# Patient Record
Sex: Female | Born: 1960 | Race: Black or African American | Hispanic: No | Smoking: Current every day smoker
Health system: Southern US, Community
[De-identification: ages and names within clinical notes are randomized; demographics above are authoritative.]

## PROBLEM LIST (undated history)

## (undated) DIAGNOSIS — E119 Type 2 diabetes mellitus without complications: Secondary | ICD-10-CM

## (undated) DIAGNOSIS — I10 Essential (primary) hypertension: Secondary | ICD-10-CM

## (undated) HISTORY — PX: FOOT SURGERY: SHX648

## (undated) HISTORY — PX: ABDOMINAL HYSTERECTOMY: SHX81

## (undated) HISTORY — PX: BREAST SURGERY: SHX581

---

## 2019-04-19 ENCOUNTER — Emergency Department: Payer: Medicaid - Out of State

## 2019-04-19 ENCOUNTER — Emergency Department
Admission: EM | Admit: 2019-04-19 | Discharge: 2019-04-19 | Disposition: A | Discharge status: Home / Self Care | Payer: Medicaid - Out of State | Attending: Emergency Medicine | Admitting: Emergency Medicine

## 2019-04-19 ENCOUNTER — Other Ambulatory Visit: Payer: Self-pay

## 2019-04-19 DIAGNOSIS — S82852A Displaced trimalleolar fracture of left lower leg, initial encounter for closed fracture: Secondary | ICD-10-CM | POA: Diagnosis not present

## 2019-04-19 DIAGNOSIS — W000XXA Fall on same level due to ice and snow, initial encounter: Secondary | ICD-10-CM | POA: Insufficient documentation

## 2019-04-19 DIAGNOSIS — F17200 Nicotine dependence, unspecified, uncomplicated: Secondary | ICD-10-CM | POA: Insufficient documentation

## 2019-04-19 DIAGNOSIS — E119 Type 2 diabetes mellitus without complications: Secondary | ICD-10-CM | POA: Insufficient documentation

## 2019-04-19 DIAGNOSIS — Y999 Unspecified external cause status: Secondary | ICD-10-CM | POA: Insufficient documentation

## 2019-04-19 DIAGNOSIS — W009XXA Unspecified fall due to ice and snow, initial encounter: Secondary | ICD-10-CM

## 2019-04-19 DIAGNOSIS — I10 Essential (primary) hypertension: Secondary | ICD-10-CM | POA: Diagnosis not present

## 2019-04-19 DIAGNOSIS — Y939 Activity, unspecified: Secondary | ICD-10-CM | POA: Diagnosis not present

## 2019-04-19 DIAGNOSIS — Y929 Unspecified place or not applicable: Secondary | ICD-10-CM | POA: Diagnosis not present

## 2019-04-19 DIAGNOSIS — S99912A Unspecified injury of left ankle, initial encounter: Secondary | ICD-10-CM | POA: Diagnosis present

## 2019-04-19 HISTORY — DX: Type 2 diabetes mellitus without complications: E11.9

## 2019-04-19 HISTORY — DX: Essential (primary) hypertension: I10

## 2019-04-19 MED ORDER — OXYCODONE-ACETAMINOPHEN 7.5-325 MG PO TABS: 1.0000 | ORAL_TABLET | Freq: Four times a day (QID) | ORAL | 0 refills | Status: AC | PRN

## 2019-04-19 MED ORDER — MORPHINE SULFATE (PF) 4 MG/ML IV SOLN
INTRAVENOUS | Status: AC
  Filled 2019-04-19: qty 1

## 2019-04-19 MED ORDER — MORPHINE SULFATE (PF) 2 MG/ML IV SOLN
INTRAVENOUS | Status: AC
  Filled 2019-04-19: qty 1

## 2019-04-19 MED ORDER — MORPHINE SULFATE (PF) 4 MG/ML IV SOLN
4.0000 mg | Freq: Once | INTRAVENOUS | Status: AC
  Administered 2019-04-19: 4 mg via INTRAVENOUS

## 2019-04-19 MED ORDER — MORPHINE SULFATE (PF) 2 MG/ML IV SOLN
2.0000 mg | Freq: Once | INTRAVENOUS | Status: AC
  Administered 2019-04-19: 2 mg via INTRAVENOUS

## 2019-04-19 NOTE — ED Notes (Signed)
ED Provider at bedside. 

## 2019-04-19 NOTE — ED Notes (Signed)
Pt hit call bell requesting ice pack to be removed, stating it was hurting more to have ice pack on. Requesting pain medication once more. Informed provider, waiting on ortho to call back.

## 2019-04-19 NOTE — ED Notes (Signed)
Xray at bedside to tib/fix xray

## 2019-04-19 NOTE — ED Provider Notes (Signed)
[facility] Emergency Department Provider Note   ____________________________________________   First MD Initiated Contact with Patient 04/19/19 [phone]     (approximate)  I have reviewed the triage vital signs and the nursing notes.   HISTORY  Chief Complaint Ankle Pain   HPI Tammy Kaiser is a 59 y.o. female presents to the ED via EMS after she fell down a wooden ramp that she believes was covered with ice.  Patient states that her left ankle snapped.  She denies any head injury or loss of consciousness.  She was given fentanyl 150 approximately 30 minutes prior to arrival reported by EMS.  She denies any previous injury to her ankle.  Patient was here visiting family.  She rates her pain as 10 over 10.       Past Medical History:  Diagnosis Date  . Diabetes mellitus without complication ([place])   . Hypertension     There are no problems to display for this patient.   Past Surgical History:  Procedure Laterality Date  . ABDOMINAL HYSTERECTOMY    . BREAST SURGERY    . FOOT SURGERY      Prior to Admission medications   Medication Sig Start Date End Date Taking? Authorizing Provider  oxyCODONE-acetaminophen (PERCOCET) 7.5-325 MG tablet Take 1 tablet by mouth every 6 (six) hours as needed for severe pain. 04/19/19 04/18/20  Johnn Hai, PA-C    Allergies Patient has no known allergies.  History reviewed. No pertinent family history.  Social History Social History   Tobacco Use  . Smoking status: Current Every Day Smoker  Substance Use Topics  . Alcohol use: Not Currently  . Drug use: Not on file    Review of Systems Constitutional: No fever/chills Eyes: No visual changes. ENT: No trauma. Cardiovascular: Denies chest pain. Respiratory: Denies shortness of breath. Gastrointestinal: No abdominal pain.  No nausea, no vomiting.  Musculoskeletal: Positive for left ankle pain. Skin: Negative for rash. Neurological: Negative for  headaches, focal weakness or numbness.  ____________________________________________   PHYSICAL EXAM:  VITAL SIGNS: ED Triage Vitals  Enc Vitals Group     BP 04/19/19 0726 (!) 160/63     Pulse Rate 04/19/19 0726 68     Resp 04/19/19 0726 18     Temp 04/19/19 0726 98 F (36.7 C)     Temp Source 04/19/19 0726 Oral     SpO2 04/19/19 0726 100 %     Weight 04/19/19 0725 207 lb (93.9 kg)     Height 04/19/19 0725 5\' 4"  (1.626 m)     Head Circumference --      Peak Flow --      Pain Score 04/19/19 0725 10     Pain Loc --      Pain Edu? --      Excl. in [place]? --    Constitutional: Alert and oriented. Well appearing and in no acute distress. Eyes: Conjunctivae are normal.  Head: Atraumatic. Neck: No stridor.   Cardiovascular: Normal rate, regular rhythm. Grossly normal heart sounds.  Good peripheral circulation. Respiratory: Normal respiratory effort.  No retractions. Lungs CTAB. Gastrointestinal: Soft and nontender. No distention.  No CVA tenderness. Musculoskeletal: Nontender left hip or thigh.  No point tenderness noted on palpation of the left knee or evidence of injury.  There is marked tenderness and soft tissue edema noted to the left ankle and patient is exquisitely tender to light palpation.  Pulse is palpable dorsalis pedis.  Motor sensory function intact  distally to the injury.  Skin is intact and no discoloration present. Neurologic:  Normal speech and language. No gross focal neurologic deficits are appreciated. No gait instability. Skin:  Skin is warm, dry and intact. No rash noted. Psychiatric: Mood and affect are normal. Speech and behavior are normal.  ____________________________________________   LABS (all labs ordered are listed, but only abnormal results are displayed)  Labs Reviewed - No data to display  RADIOLOGY   Official radiology report(s): DG Tibia/Fibula Left  Result Date: 04/19/2019 CLINICAL DATA:  Fall.  Ankle injury. EXAM: LEFT TIBIA AND FIBULA -  2 VIEW COMPARISON:  No prior. FINDINGS: Diffuse soft tissue swelling. Small medial malleolar displaced avulsion fractures noted. Prominent displaced oblique fracture of the distal left fibular shaft noted. Slight displaced fracture of the posterior malleolus noted. IMPRESSION: Trimalleolar fracture. Electronically Signed   By: Maisie Fus  Register   On: 04/19/2019 09:01   DG Ankle Complete Left  Result Date: 04/19/2019 CLINICAL DATA:  Pain following fall EXAM: LEFT ANKLE COMPLETE - 3+ VIEW COMPARISON:  None. FINDINGS: Frontal, oblique, and lateral views were obtained. There is a spiral fracture of the distal fibular diaphysis with mild posterior angulation and lateral displacement of the distal major fracture fragment with respect proximal fragment. There is an avulsion arising from the medial malleolus. There is ankle mortise disruption with the hindfoot posterior to the tibial plafond. There is no appreciable joint space narrowing. There is an inferior calcaneal spur. IMPRESSION: Comminuted spiral fracture of the distal fibular diaphysis with displacement and angulation. Avulsion medial malleolus. Ankle mortise disruption with the hindfoot posterior to the tibial plafond. Inferior calcaneal spur noted. Electronically Signed   By: Bretta Bang III M.D.   On: 04/19/2019 07:56    ____________________________________________   PROCEDURES  Procedure(s) performed (including Critical Care):  Procedures Posterior and sugar tong OCL splint was applied to the left lower extremity and patient was given crutches.  ____________________________________________   INITIAL IMPRESSION / ASSESSMENT AND PLAN / ED COURSE  As part of my medical decision making, I reviewed the following data within the electronic MEDICAL RECORD NUMBER Notes from prior ED visits and [place] Controlled Substance Database  59 year old female presents to the ED via EMS with complaint of left ankle pain after she slipped on ice this morning.   Physical exam is consistent with a left ankle fracture.  X-rays showed a trimalleolar fracture which was discussed with Dr. Rosita Kea who is on-call for orthopedics today.  After talking with the patient she prefers to go back home to [place] and states she will figure out a way to get there.  Patient was made aware that she cannot take pain medication and drive.  She is also aware that she cannot weight-bear on her ankle and was given a set of crutches.  A prescription was sent to a local pharmacy for her family to pick up for pain.  Patient is encouraged to ice elevate her ankle as frequently as possible and to not remove the splint until she has been seen by the orthopedist in [place].  A copy of her x-rays was put on a CD and patient was handed these to take to her orthopedist.  ____________________________________________   FINAL CLINICAL IMPRESSION(S) / ED DIAGNOSES  Final diagnoses:  Trimalleolar fracture of ankle, closed, left, initial encounter  Fall due to slipping on ice or snow, initial encounter     ED Discharge Orders         Ordered  oxyCODONE-acetaminophen (PERCOCET) 7.5-325 MG tablet  Every 6 hours PRN     04/19/19 1009           Note:  This document was prepared using Dragon voice recognition software and may include unintentional dictation errors.    Tommi Rumps, PA-C 04/19/19 1317    Chesley Noon, MD 04/19/19 2139

## 2019-04-19 NOTE — Discharge Instructions (Signed)
Call and make an appointment with your orthopedist as soon as you get home.  Leave the splint on until seen by your orthopedist.  Ice and elevate frequently to decrease swelling and help with pain.  Do not take your pain medication and drive as it could cause drowsiness and increase your risk for injury.  Use crutches anytime you are up and do not bear weight on your injured leg.  Take the CD to your doctor's appointment so that they can see your x-rays.

## 2019-04-19 NOTE — ED Notes (Signed)
Pt hit call bell requesting pain medicine. Informed provider. Waiting to hear back from ortho at this time.

## 2019-04-19 NOTE — ED Notes (Signed)
Pt hit call bell requesting more pain medicine. Informed provider. Provider at bedside.

## 2019-04-19 NOTE — ED Triage Notes (Signed)
Pt arrives via [facility] from slipping on ice and injuring L ankle. Swelling and deformity noted. fentanyl given by EMS. 20 L AC. VSS wit EMS. Pt requested pain medication multiple times while in triage.

## 2019-04-19 NOTE — ED Notes (Signed)
EMS stated they gave pt fentanyl between 7 and 7:05am.

## 2020-11-02 IMAGING — DX DG ANKLE COMPLETE 3+V*L*
3 series · 3 of 3 positions shown · non-contrast
Comparison: None.

CLINICAL DATA: Pain following fall

EXAM:
LEFT ANKLE COMPLETE - 3+ VIEW

[ankle ap]
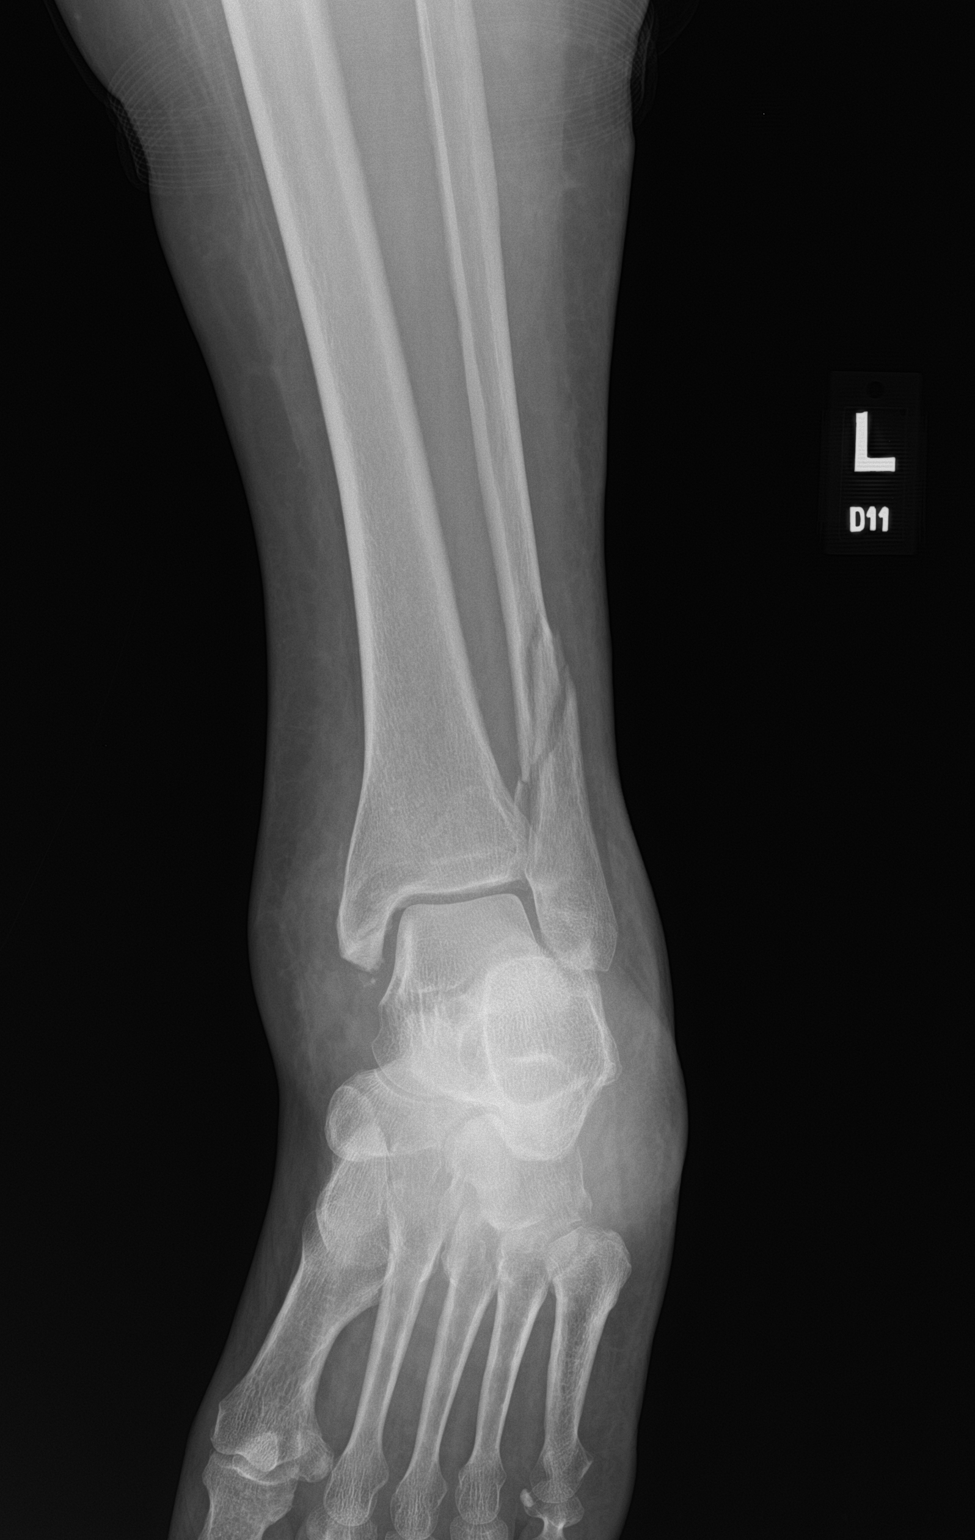

[ankle lat]
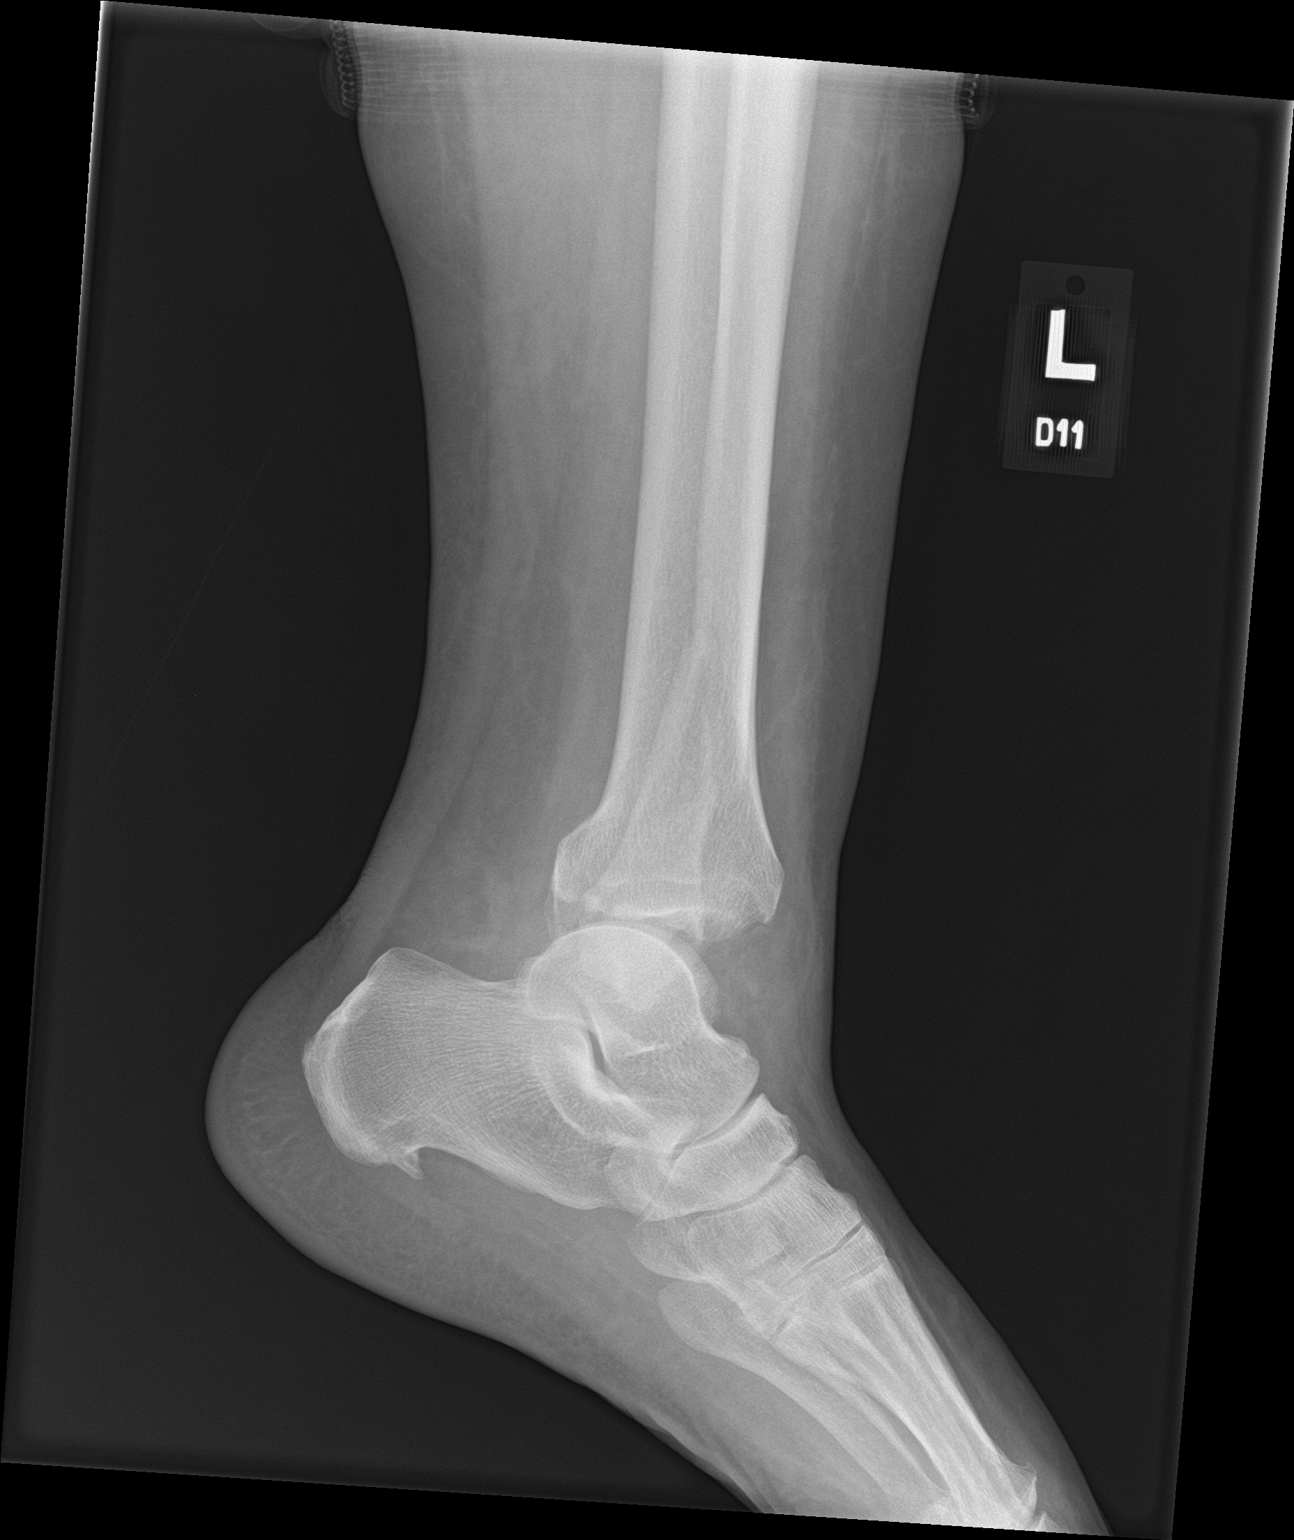

[ankle obl]
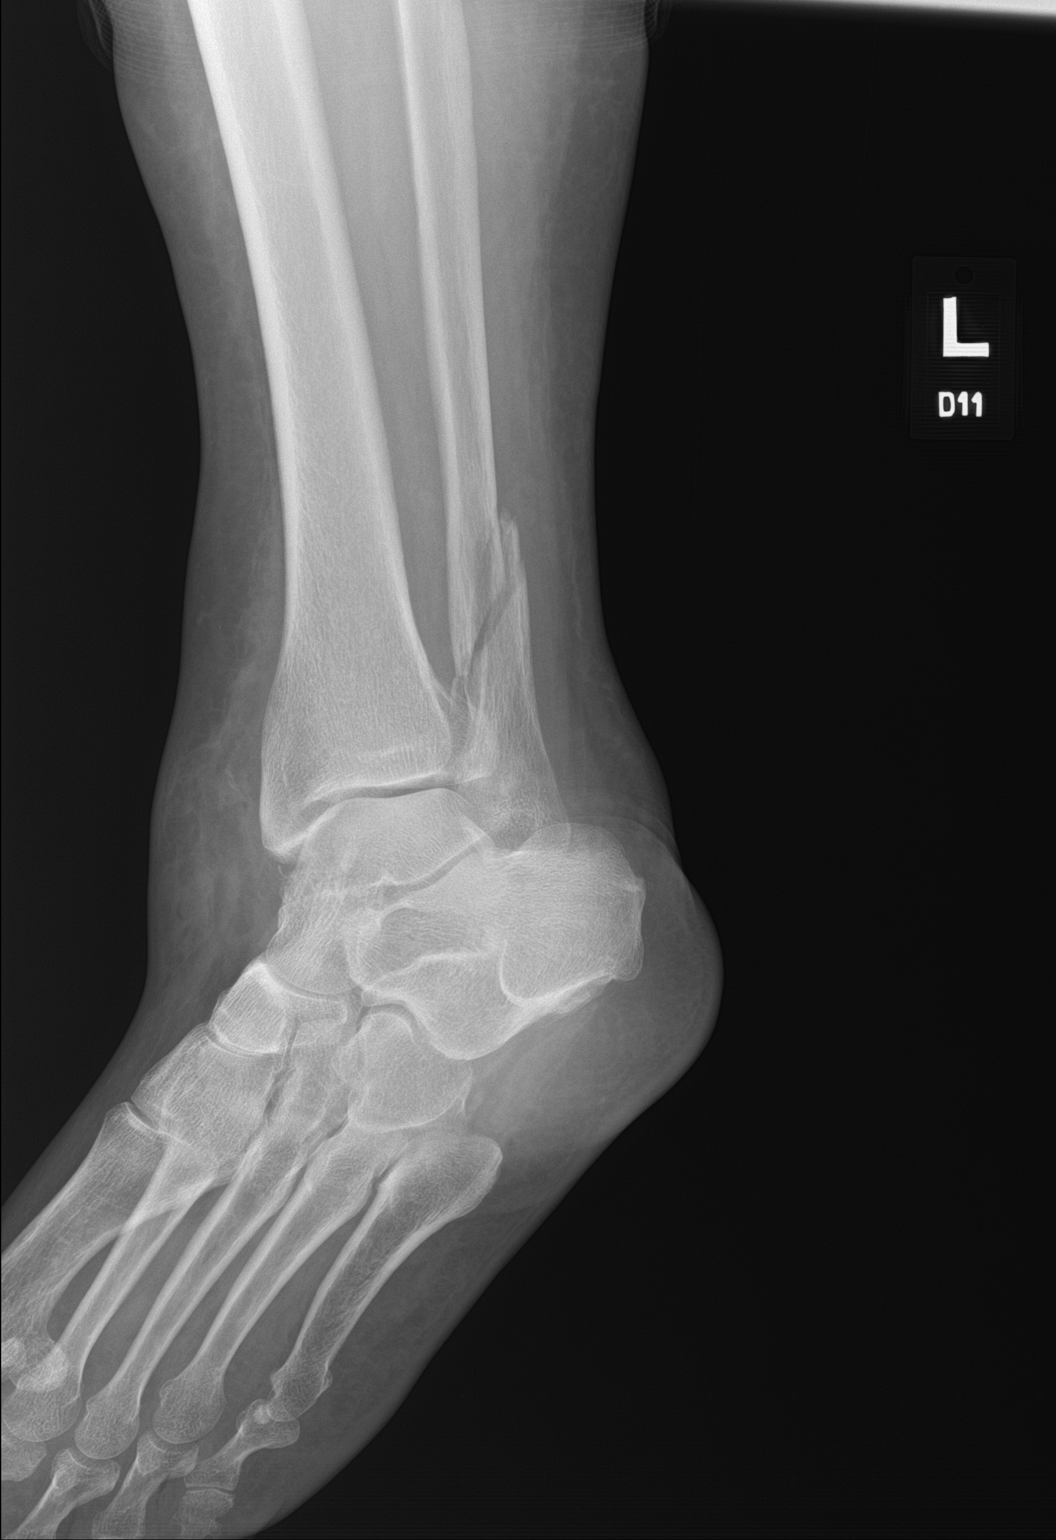

[3 of 3 positions shown; findings below may reference images not displayed]

FINDINGS: Frontal, oblique, and lateral views were obtained. There is a spiral
fracture of the distal fibular diaphysis with mild posterior
angulation and lateral displacement of the distal major fracture
fragment with respect proximal fragment. There is an avulsion
arising from the medial malleolus. There is ankle mortise disruption
with the hindfoot posterior to the tibial plafond.

There is no appreciable joint space narrowing. There is an inferior
calcaneal spur.
IMPRESSION: Comminuted spiral fracture of the distal fibular diaphysis with
displacement and angulation. Avulsion medial malleolus. Ankle
mortise disruption with the hindfoot posterior to the tibial
plafond.

Inferior calcaneal spur noted.
